# Patient Record
Sex: Male | Born: 1955 | Race: Black or African American | Hispanic: No | Marital: Single | Smoking: Current every day smoker
Health system: Southern US, Community
[De-identification: ages and names within clinical notes are randomized; demographics above are authoritative.]

## PROBLEM LIST (undated history)

## (undated) DIAGNOSIS — M549 Dorsalgia, unspecified: Secondary | ICD-10-CM

## (undated) DIAGNOSIS — R569 Unspecified convulsions: Secondary | ICD-10-CM

## (undated) DIAGNOSIS — W3400XA Accidental discharge from unspecified firearms or gun, initial encounter: Secondary | ICD-10-CM

## (undated) DIAGNOSIS — F32A Depression, unspecified: Secondary | ICD-10-CM

## (undated) DIAGNOSIS — F329 Major depressive disorder, single episode, unspecified: Secondary | ICD-10-CM

---

## 2015-10-09 ENCOUNTER — Emergency Department (HOSPITAL_COMMUNITY): Payer: Medicaid Other

## 2015-10-09 ENCOUNTER — Emergency Department (HOSPITAL_COMMUNITY)
Admission: EM | Admit: 2015-10-09 | Discharge: 2015-10-09 | Disposition: A | Discharge status: Home / Self Care | Payer: Medicaid Other | Attending: Emergency Medicine | Admitting: Emergency Medicine

## 2015-10-09 ENCOUNTER — Encounter (HOSPITAL_COMMUNITY): Payer: Self-pay | Admitting: Emergency Medicine

## 2015-10-09 DIAGNOSIS — K5900 Constipation, unspecified: Secondary | ICD-10-CM | POA: Insufficient documentation

## 2015-10-09 DIAGNOSIS — N201 Calculus of ureter: Secondary | ICD-10-CM | POA: Insufficient documentation

## 2015-10-09 DIAGNOSIS — R1030 Lower abdominal pain, unspecified: Secondary | ICD-10-CM | POA: Diagnosis present

## 2015-10-09 DIAGNOSIS — N200 Calculus of kidney: Secondary | ICD-10-CM

## 2015-10-09 HISTORY — DX: Unspecified convulsions: R56.9

## 2015-10-09 LAB — CBC WITH DIFFERENTIAL/PLATELET
BASOS ABS: 0 10*3/uL (ref 0.0–0.1)
BASOS PCT: 0 %
EOS ABS: 0.1 10*3/uL (ref 0.0–0.7)
Eosinophils Relative: 2 %
HCT: 39.8 % (ref 39.0–52.0)
Hemoglobin: 13.6 g/dL (ref 13.0–17.0)
Lymphocytes Relative: 26 %
Lymphs Abs: 1.8 10*3/uL (ref 0.7–4.0)
MCH: 32 pg (ref 26.0–34.0)
MCHC: 34.2 g/dL (ref 30.0–36.0)
MCV: 93.6 fL (ref 78.0–100.0)
Monocytes Absolute: 0.6 10*3/uL (ref 0.1–1.0)
Monocytes Relative: 8 %
NEUTROS PCT: 64 %
Neutro Abs: 4.3 10*3/uL (ref 1.7–7.7)
Platelets: 300 10*3/uL (ref 150–400)
RBC: 4.25 MIL/uL (ref 4.22–5.81)
RDW: 12.1 % (ref 11.5–15.5)
WBC: 6.8 10*3/uL (ref 4.0–10.5)

## 2015-10-09 LAB — I-STAT CHEM 8, ED
BUN: 14 mg/dL (ref 6–20)
CHLORIDE: 99 mmol/L — AB (ref 101–111)
CREATININE: 1.5 mg/dL — AB (ref 0.61–1.24)
Calcium, Ion: 1.09 mmol/L — ABNORMAL LOW (ref 1.15–1.40)
Glucose, Bld: 99 mg/dL (ref 65–99)
HEMATOCRIT: 41 % (ref 39.0–52.0)
HEMOGLOBIN: 13.9 g/dL (ref 13.0–17.0)
POTASSIUM: 3.6 mmol/L (ref 3.5–5.1)
Sodium: 138 mmol/L (ref 135–145)
TCO2: 28 mmol/L (ref 0–100)

## 2015-10-09 MED ORDER — KETOROLAC TROMETHAMINE 30 MG/ML IJ SOLN
30.0000 mg | Freq: Once | INTRAMUSCULAR | Status: AC
  Administered 2015-10-09: 30 mg via INTRAVENOUS
  Filled 2015-10-09: qty 1

## 2015-10-09 MED ORDER — POLYETHYLENE GLYCOL 3350 17 G PO PACK: 17.0000 g | PACK | Freq: Every day | ORAL | 0 refills | Status: AC

## 2015-10-09 MED ORDER — SODIUM CHLORIDE 0.9 % IV BOLUS (SEPSIS)
1000.0000 mL | Freq: Once | INTRAVENOUS | Status: AC
  Administered 2015-10-09: 1000 mL via INTRAVENOUS

## 2015-10-09 MED ORDER — ONDANSETRON 8 MG PO TBDP: ORAL_TABLET | ORAL | 0 refills | Status: AC

## 2015-10-09 MED ORDER — TAMSULOSIN HCL 0.4 MG PO CAPS
0.4000 mg | ORAL_CAPSULE | Freq: Every day | ORAL | Status: DC
  Administered 2015-10-09: 0.4 mg via ORAL
  Filled 2015-10-09: qty 1

## 2015-10-09 MED ORDER — DICLOFENAC SODIUM ER 100 MG PO TB24: 100.0000 mg | ORAL_TABLET | Freq: Every day | ORAL | 0 refills | Status: AC

## 2015-10-09 MED ORDER — IOPAMIDOL (ISOVUE-300) INJECTION 61%
INTRAVENOUS | Status: AC
  Filled 2015-10-09: qty 100

## 2015-10-09 MED ORDER — TAMSULOSIN HCL 0.4 MG PO CAPS: 0.4000 mg | ORAL_CAPSULE | Freq: Every day | ORAL | 0 refills | Status: AC

## 2015-10-09 MED ORDER — ONDANSETRON HCL 4 MG/2ML IJ SOLN
4.0000 mg | Freq: Once | INTRAMUSCULAR | Status: DC
  Filled 2015-10-09: qty 2

## 2015-10-09 NOTE — ED Notes (Signed)
Pt understood dc material. NAD noted. Scripts given at dc 
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
## 2015-10-09 NOTE — ED Triage Notes (Signed)
Pt c/o of abdominal pain and constipation. Pt has tried over the counter medicines with no relief

## 2016-04-14 ENCOUNTER — Emergency Department (HOSPITAL_COMMUNITY): Payer: Medicaid Other

## 2016-04-14 ENCOUNTER — Encounter (HOSPITAL_COMMUNITY): Payer: Self-pay | Admitting: *Deleted

## 2016-04-14 ENCOUNTER — Emergency Department (HOSPITAL_COMMUNITY)
Admission: EM | Admit: 2016-04-14 | Discharge: 2016-04-14 | Disposition: A | Discharge status: Home / Self Care | Payer: Medicaid Other | Attending: Emergency Medicine | Admitting: Emergency Medicine

## 2016-04-14 DIAGNOSIS — Z79899 Other long term (current) drug therapy: Secondary | ICD-10-CM | POA: Diagnosis not present

## 2016-04-14 DIAGNOSIS — S161XXA Strain of muscle, fascia and tendon at neck level, initial encounter: Secondary | ICD-10-CM | POA: Diagnosis not present

## 2016-04-14 DIAGNOSIS — Y999 Unspecified external cause status: Secondary | ICD-10-CM | POA: Insufficient documentation

## 2016-04-14 DIAGNOSIS — Y939 Activity, unspecified: Secondary | ICD-10-CM | POA: Insufficient documentation

## 2016-04-14 DIAGNOSIS — F17200 Nicotine dependence, unspecified, uncomplicated: Secondary | ICD-10-CM | POA: Insufficient documentation

## 2016-04-14 DIAGNOSIS — Y92410 Unspecified street and highway as the place of occurrence of the external cause: Secondary | ICD-10-CM | POA: Diagnosis not present

## 2016-04-14 DIAGNOSIS — S0990XA Unspecified injury of head, initial encounter: Secondary | ICD-10-CM | POA: Diagnosis present

## 2016-04-14 DIAGNOSIS — Z23 Encounter for immunization: Secondary | ICD-10-CM | POA: Diagnosis not present

## 2016-04-14 DIAGNOSIS — S060X0A Concussion without loss of consciousness, initial encounter: Secondary | ICD-10-CM | POA: Diagnosis not present

## 2016-04-14 HISTORY — DX: Major depressive disorder, single episode, unspecified: F32.9

## 2016-04-14 HISTORY — DX: Accidental discharge from unspecified firearms or gun, initial encounter: W34.00XA

## 2016-04-14 HISTORY — DX: Dorsalgia, unspecified: M54.9

## 2016-04-14 HISTORY — DX: Depression, unspecified: F32.A

## 2016-04-14 MED ORDER — ACETAMINOPHEN 325 MG PO TABS
650.0000 mg | ORAL_TABLET | Freq: Once | ORAL | Status: AC
  Administered 2016-04-14: 650 mg via ORAL
  Filled 2016-04-14: qty 2

## 2016-04-14 MED ORDER — TETANUS-DIPHTH-ACELL PERTUSSIS 5-2.5-18.5 LF-MCG/0.5 IM SUSP
0.5000 mL | Freq: Once | INTRAMUSCULAR | Status: AC
  Administered 2016-04-14: 0.5 mL via INTRAMUSCULAR
  Filled 2016-04-14: qty 0.5
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
## 2016-04-14 NOTE — ED Notes (Signed)
Patient transported to CT 

## 2016-04-14 NOTE — ED Notes (Signed)
C-spine cleared and collar removed by Dr. Bebe Shaggy.

## 2016-04-14 NOTE — Progress Notes (Signed)
Orthopedic Tech Progress Note Patient Details:  Aaron Mccarty Jun 14, 1955 [id number]  Ortho Devices Type of Ortho Device: [place] cervical collar Ortho Device/Splint Interventions: Ordered, Application   Trinna Post 04/14/2016, 1:37 AM
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
